# Patient Record
Sex: Male | Born: 1995 | Race: White | Hispanic: No | Marital: Single | Smoking: Never smoker
Health system: Southern US, Community
[De-identification: ages and names within clinical notes are randomized; demographics above are authoritative.]

## PROBLEM LIST (undated history)

## (undated) DIAGNOSIS — R29898 Other symptoms and signs involving the musculoskeletal system: Secondary | ICD-10-CM

## (undated) DIAGNOSIS — F329 Major depressive disorder, single episode, unspecified: Secondary | ICD-10-CM

## (undated) DIAGNOSIS — F419 Anxiety disorder, unspecified: Secondary | ICD-10-CM

## (undated) DIAGNOSIS — F32A Depression, unspecified: Secondary | ICD-10-CM

## (undated) HISTORY — PX: TYMPANOSTOMY TUBE PLACEMENT: SHX32

## (undated) HISTORY — DX: Anxiety disorder, unspecified: F41.9

## (undated) HISTORY — DX: Depression, unspecified: F32.A

---
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
## 2016-09-30 ENCOUNTER — Encounter: Payer: BLUE CROSS/BLUE SHIELD | Admitting: Family Medicine

## 2016-12-05 DIAGNOSIS — R51 Headache: Secondary | ICD-10-CM | POA: Diagnosis not present

## 2016-12-05 DIAGNOSIS — R202 Paresthesia of skin: Secondary | ICD-10-CM | POA: Diagnosis not present

## 2016-12-05 DIAGNOSIS — R42 Dizziness and giddiness: Secondary | ICD-10-CM | POA: Diagnosis not present

## 2016-12-19 DIAGNOSIS — R51 Headache: Secondary | ICD-10-CM | POA: Diagnosis not present

## 2016-12-19 DIAGNOSIS — R42 Dizziness and giddiness: Secondary | ICD-10-CM | POA: Diagnosis not present

## 2016-12-19 DIAGNOSIS — M62838 Other muscle spasm: Secondary | ICD-10-CM | POA: Diagnosis not present

## 2016-12-19 DIAGNOSIS — R202 Paresthesia of skin: Secondary | ICD-10-CM | POA: Diagnosis not present

## 2016-12-23 DIAGNOSIS — M9902 Segmental and somatic dysfunction of thoracic region: Secondary | ICD-10-CM | POA: Diagnosis not present

## 2016-12-23 DIAGNOSIS — M25511 Pain in right shoulder: Secondary | ICD-10-CM | POA: Diagnosis not present

## 2016-12-23 DIAGNOSIS — M9903 Segmental and somatic dysfunction of lumbar region: Secondary | ICD-10-CM | POA: Diagnosis not present

## 2016-12-23 DIAGNOSIS — M9901 Segmental and somatic dysfunction of cervical region: Secondary | ICD-10-CM | POA: Diagnosis not present

## 2017-01-02 DIAGNOSIS — K920 Hematemesis: Secondary | ICD-10-CM | POA: Diagnosis not present

## 2017-01-03 DIAGNOSIS — K920 Hematemesis: Secondary | ICD-10-CM | POA: Diagnosis not present

## 2017-01-20 DIAGNOSIS — M9903 Segmental and somatic dysfunction of lumbar region: Secondary | ICD-10-CM | POA: Diagnosis not present

## 2017-01-20 DIAGNOSIS — M9901 Segmental and somatic dysfunction of cervical region: Secondary | ICD-10-CM | POA: Diagnosis not present

## 2017-01-20 DIAGNOSIS — M25511 Pain in right shoulder: Secondary | ICD-10-CM | POA: Diagnosis not present

## 2017-01-20 DIAGNOSIS — M9902 Segmental and somatic dysfunction of thoracic region: Secondary | ICD-10-CM | POA: Diagnosis not present

## 2017-01-23 DIAGNOSIS — R51 Headache: Secondary | ICD-10-CM | POA: Diagnosis not present

## 2017-01-23 DIAGNOSIS — R202 Paresthesia of skin: Secondary | ICD-10-CM | POA: Diagnosis not present

## 2017-01-23 DIAGNOSIS — R42 Dizziness and giddiness: Secondary | ICD-10-CM | POA: Diagnosis not present

## 2017-02-05 DIAGNOSIS — R202 Paresthesia of skin: Secondary | ICD-10-CM | POA: Diagnosis not present

## 2017-02-05 DIAGNOSIS — E538 Deficiency of other specified B group vitamins: Secondary | ICD-10-CM | POA: Diagnosis not present

## 2017-02-26 DIAGNOSIS — L239 Allergic contact dermatitis, unspecified cause: Secondary | ICD-10-CM | POA: Diagnosis not present

## 2017-04-07 DIAGNOSIS — R5383 Other fatigue: Secondary | ICD-10-CM | POA: Diagnosis not present

## 2017-04-27 DIAGNOSIS — R202 Paresthesia of skin: Secondary | ICD-10-CM | POA: Diagnosis not present

## 2017-11-11 DIAGNOSIS — F419 Anxiety disorder, unspecified: Secondary | ICD-10-CM | POA: Diagnosis not present

## 2017-12-01 DIAGNOSIS — F419 Anxiety disorder, unspecified: Secondary | ICD-10-CM | POA: Diagnosis not present

## 2017-12-01 DIAGNOSIS — R109 Unspecified abdominal pain: Secondary | ICD-10-CM | POA: Diagnosis not present

## 2017-12-01 DIAGNOSIS — R112 Nausea with vomiting, unspecified: Secondary | ICD-10-CM | POA: Diagnosis not present

## 2017-12-16 DIAGNOSIS — R1110 Vomiting, unspecified: Secondary | ICD-10-CM | POA: Diagnosis not present

## 2017-12-16 DIAGNOSIS — R1084 Generalized abdominal pain: Secondary | ICD-10-CM | POA: Diagnosis not present

## 2017-12-16 DIAGNOSIS — R197 Diarrhea, unspecified: Secondary | ICD-10-CM | POA: Diagnosis not present

## 2017-12-31 DIAGNOSIS — R1084 Generalized abdominal pain: Secondary | ICD-10-CM | POA: Diagnosis not present

## 2018-01-08 DIAGNOSIS — R110 Nausea: Secondary | ICD-10-CM | POA: Diagnosis not present

## 2018-01-08 DIAGNOSIS — R1084 Generalized abdominal pain: Secondary | ICD-10-CM | POA: Diagnosis not present

## 2018-01-18 DIAGNOSIS — Z79899 Other long term (current) drug therapy: Secondary | ICD-10-CM | POA: Diagnosis not present

## 2018-01-18 DIAGNOSIS — R112 Nausea with vomiting, unspecified: Secondary | ICD-10-CM | POA: Diagnosis not present

## 2018-01-18 DIAGNOSIS — R109 Unspecified abdominal pain: Secondary | ICD-10-CM | POA: Diagnosis not present

## 2018-01-18 DIAGNOSIS — K648 Other hemorrhoids: Secondary | ICD-10-CM | POA: Diagnosis not present

## 2018-01-18 DIAGNOSIS — K2950 Unspecified chronic gastritis without bleeding: Secondary | ICD-10-CM | POA: Diagnosis not present

## 2018-01-18 DIAGNOSIS — Z6822 Body mass index (BMI) 22.0-22.9, adult: Secondary | ICD-10-CM | POA: Diagnosis not present

## 2018-01-18 DIAGNOSIS — F419 Anxiety disorder, unspecified: Secondary | ICD-10-CM | POA: Diagnosis not present

## 2018-01-18 DIAGNOSIS — R197 Diarrhea, unspecified: Secondary | ICD-10-CM | POA: Diagnosis not present

## 2018-01-18 DIAGNOSIS — R1084 Generalized abdominal pain: Secondary | ICD-10-CM | POA: Diagnosis not present

## 2018-01-18 DIAGNOSIS — R1013 Epigastric pain: Secondary | ICD-10-CM | POA: Diagnosis not present

## 2018-01-18 DIAGNOSIS — R634 Abnormal weight loss: Secondary | ICD-10-CM | POA: Diagnosis not present

## 2018-06-08 ENCOUNTER — Telehealth: Payer: Self-pay

## 2018-06-08 NOTE — Telephone Encounter (Signed)
Called pt and ask to call me back on my direct number to schedule for an appt of CPE appt last seen in 2017.

## 2018-10-28 DIAGNOSIS — M9903 Segmental and somatic dysfunction of lumbar region: Secondary | ICD-10-CM | POA: Diagnosis not present

## 2018-10-28 DIAGNOSIS — M62830 Muscle spasm of back: Secondary | ICD-10-CM | POA: Diagnosis not present

## 2018-10-28 DIAGNOSIS — M5416 Radiculopathy, lumbar region: Secondary | ICD-10-CM | POA: Diagnosis not present

## 2018-10-28 DIAGNOSIS — M9901 Segmental and somatic dysfunction of cervical region: Secondary | ICD-10-CM | POA: Diagnosis not present

## 2018-10-29 ENCOUNTER — Other Ambulatory Visit: Payer: Self-pay

## 2018-10-29 ENCOUNTER — Ambulatory Visit: Payer: BLUE CROSS/BLUE SHIELD | Admitting: Family Medicine

## 2018-10-29 ENCOUNTER — Encounter: Payer: Self-pay | Admitting: Family Medicine

## 2018-10-29 VITALS — BP 102/64 | HR 64 | Temp 97.1°F | Resp 16 | Ht 69.0 in | Wt 153.4 lb

## 2018-10-29 DIAGNOSIS — F419 Anxiety disorder, unspecified: Secondary | ICD-10-CM | POA: Diagnosis not present

## 2018-10-29 DIAGNOSIS — E538 Deficiency of other specified B group vitamins: Secondary | ICD-10-CM

## 2018-10-29 DIAGNOSIS — F329 Major depressive disorder, single episode, unspecified: Secondary | ICD-10-CM | POA: Diagnosis not present

## 2018-10-29 DIAGNOSIS — F32A Depression, unspecified: Secondary | ICD-10-CM

## 2018-10-29 LAB — COMPREHENSIVE METABOLIC PANEL
ALT: 14 U/L (ref 0–53)
AST: 15 U/L (ref 0–37)
Albumin: 4.6 g/dL (ref 3.5–5.2)
Alkaline Phosphatase: 75 U/L (ref 39–117)
BUN: 10 mg/dL (ref 6–23)
CO2: 31 mEq/L (ref 19–32)
Calcium: 9.8 mg/dL (ref 8.4–10.5)
Chloride: 105 mEq/L (ref 96–112)
Creatinine, Ser: 0.97 mg/dL (ref 0.40–1.50)
GFR: 96.45 mL/min (ref >60.00)
Glucose, Bld: 82 mg/dL (ref 70–99)
Potassium: 4.3 mEq/L (ref 3.5–5.1)
Sodium: 142 mEq/L (ref 135–145)
Total Bilirubin: 0.4 mg/dL (ref 0.2–1.2)
Total Protein: 6.8 g/dL (ref 6.0–8.3)

## 2018-10-29 LAB — CBC WITH DIFFERENTIAL/PLATELET
Basophils Absolute: 0 10*3/uL (ref 0.0–0.1)
Basophils Relative: 0.4 % (ref 0.0–3.0)
Eosinophils Absolute: 0.1 10*3/uL (ref 0.0–0.7)
Eosinophils Relative: 1.8 % (ref 0.0–5.0)
HCT: 41.8 % (ref 39.0–52.0)
Hemoglobin: 14.3 g/dL (ref 13.0–17.0)
Lymphocytes Relative: 36.9 % (ref 12.0–46.0)
Lymphs Abs: 1.4 10*3/uL (ref 0.7–4.0)
MCHC: 34.1 g/dL (ref 30.0–36.0)
MCV: 88.2 fl (ref 78.0–100.0)
Monocytes Absolute: 0.4 10*3/uL (ref 0.1–1.0)
Monocytes Relative: 9.8 % (ref 3.0–12.0)
Neutro Abs: 2 10*3/uL (ref 1.4–7.7)
Neutrophils Relative %: 51.1 % (ref 43.0–77.0)
Platelets: 204 10*3/uL (ref 150.0–400.0)
RBC: 4.74 Mil/uL (ref 4.22–5.81)
RDW: 12.8 % (ref 11.5–15.5)
WBC: 3.9 10*3/uL — ABNORMAL LOW (ref 4.0–10.5)

## 2018-10-29 LAB — IBC + FERRITIN
Ferritin: 44.8 ng/mL (ref 22.0–322.0)
Iron: 102 ug/dL (ref 42–165)
Saturation Ratios: 31 % (ref 20.0–50.0)
Transferrin: 235 mg/dL (ref 212.0–360.0)

## 2018-10-29 LAB — B12 AND FOLATE PANEL
Folate: 9.1 ng/mL (ref >5.9)
Vitamin B-12: 164 pg/mL — ABNORMAL LOW (ref 211–911)

## 2018-10-29 LAB — VITAMIN D 25 HYDROXY (VIT D DEFICIENCY, FRACTURES): VITD: 31.99 ng/mL (ref 30.00–100.00)

## 2018-10-29 LAB — TSH: TSH: 1.62 u[IU]/mL (ref 0.35–4.50)

## 2018-10-29 MED ORDER — MIRTAZAPINE 30 MG PO TABS: 30.0000 mg | ORAL_TABLET | Freq: Every day | ORAL | 1 refills | Status: DC

## 2018-10-29 MED ORDER — CYANOCOBALAMIN 500 MCG PO TABS: 500.0000 ug | ORAL_TABLET | Freq: Every day | ORAL | 2 refills | Status: AC

## 2018-10-29 MED ORDER — HYDROXYZINE HCL 25 MG PO TABS: ORAL_TABLET | ORAL | 2 refills | Status: DC

## 2018-10-29 NOTE — Addendum Note (Signed)
Addended by: Philis Nettle on: 10/29/2018 04:50 PM   Modules accepted: Orders

## 2018-10-29 NOTE — Progress Notes (Signed)
Subjective:    Patient ID: Jackson Baxter, male    DOB: June 15, 1995, 23 y.o.   MRN: [id number]  HPI   Patient presents to clinic to establish with PCP.  He had previously come to our office, but was last seen in 2017 by Dr. Dan HumphreysWalker.  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  States while away at school, he began develop issues with anxiety and depression and was treated by doctors out in [place].  He originally was started on Wellbutrin, but started to have issues with eating and weight loss so they switched him over to Remeron which for the most part keeps him stable, feels he sleeps well and eats well on this medication.  Also uses hydroxyzine as needed for breakthrough anxiety.  Denies any SI or HI.  Overall is pleased to be back home to spend time with his parents, but his goal over the next 6 months to a year is to line up a job back in [place], has a lot of friends in the region and would prefer to live there rather than back home in [place] where he grew up.  Patient also self-reports a history of B12 deficiency and iron deficiency, we will be sure to follow-up and check these in the blood work.  Past medical, social, surgical family history reviewed and updated in chart.  Flu vaccine is up-to-date, had yesterday at the [facility].  Patient Active Problem List   Diagnosis Date Noted  . Depression with anxiety 09/26/2015  . Vomiting alone 09/25/2014  . Cerumen impaction 09/25/2014  . Routine general medical examination at a health care facility 07/29/2012   Social History   Tobacco Use  . Smoking status: Never Smoker  . Smokeless tobacco: Never Used  Substance Use Topics  . Alcohol use: Yes    Comment: ocassionally   Family History  Problem Relation Age of Onset  . Hypertension Mother   . Hyperlipidemia Father   . Cancer Father        melanoma  . Diabetes Paternal Aunt   . Diabetes  Paternal Grandmother   . Diabetes Paternal Grandfather    Social History   Tobacco Use  . Smoking status: Never Smoker  . Smokeless tobacco: Never Used  Substance Use Topics  . Alcohol use: Yes    Comment: ocassionally   Review of Systems   Constitutional: Negative for chills, fatigue and fever.  HENT: Negative for congestion, ear pain, sinus pain and sore throat.   Eyes: Negative.   Respiratory: Negative for cough, shortness of breath and wheezing.   Cardiovascular: Negative for chest pain, palpitations and leg swelling.  Gastrointestinal: Negative for abdominal pain, diarrhea, nausea and vomiting.  Genitourinary: Negative for dysuria, frequency and urgency.  Musculoskeletal: Negative for arthralgias and myalgias.  Skin: Negative for color change, pallor and rash.  Neurological: Negative for syncope, light-headedness and headaches.  Psychiatric/Behavioral: The patient is not nervous/anxious.       Objective:   Physical Exam Vitals signs and nursing note reviewed.  Constitutional:      General: He is not in acute distress.    Appearance: Normal appearance. He is not toxic-appearing.  Eyes:     General: No scleral icterus.    Extraocular Movements: Extraocular movements intact.     Pupils: Pupils are equal, round, and reactive to light.  Cardiovascular:     Rate and Rhythm: Normal rate and regular rhythm.  Heart sounds: Normal heart sounds.  Pulmonary:     Effort: Pulmonary effort is normal. No respiratory distress.     Breath sounds: Normal breath sounds.  Musculoskeletal:     Right lower leg: No edema.     Left lower leg: No edema.  Neurological:     General: No focal deficit present.     Mental Status: He is alert and oriented to person, place, and time.     Gait: Gait normal.  Psychiatric:        Mood and Affect: Mood normal.        Behavior: Behavior normal.     Today's Vitals   10/29/18 1320  BP: 102/64  Pulse: 64  Resp: 16  Temp: (!) 97.1 F (36.2  C)  TempSrc: Temporal  SpO2: 98%  Weight: 153 lb 6.4 oz (69.6 kg)  Height: 5\' 9"  (1.753 m)   Body mass index is 22.65 kg/m.     Assessment & Plan:    Anxiety and depression - patient's mood is stable on Remeron 30 mg/day and uses hydroxyzine as needed.  Patient does not want to see a counselor at this time, but he is now aware that we do offer counseling services through the lobe our behavioral health clinics and we do have a licensed clinical social worker on staff here that we can set him up with if he chooses to do this.  Patient has good support from his parents and they are helping him navigate his next steps including job hunting and preparing to hopefully move back to [place].  Lab work drawn in clinic.  Patient will follow-up in about 4 weeks for recheck and how he is doing on medications and we will also do a CPE at that time.

## 2018-11-04 DIAGNOSIS — M9901 Segmental and somatic dysfunction of cervical region: Secondary | ICD-10-CM | POA: Diagnosis not present

## 2018-11-04 DIAGNOSIS — M62830 Muscle spasm of back: Secondary | ICD-10-CM | POA: Diagnosis not present

## 2018-11-04 DIAGNOSIS — M5416 Radiculopathy, lumbar region: Secondary | ICD-10-CM | POA: Diagnosis not present

## 2018-11-04 DIAGNOSIS — M9903 Segmental and somatic dysfunction of lumbar region: Secondary | ICD-10-CM | POA: Diagnosis not present

## 2018-11-11 DIAGNOSIS — M9903 Segmental and somatic dysfunction of lumbar region: Secondary | ICD-10-CM | POA: Diagnosis not present

## 2018-11-11 DIAGNOSIS — M9901 Segmental and somatic dysfunction of cervical region: Secondary | ICD-10-CM | POA: Diagnosis not present

## 2018-11-11 DIAGNOSIS — M62830 Muscle spasm of back: Secondary | ICD-10-CM | POA: Diagnosis not present

## 2018-11-11 DIAGNOSIS — M5416 Radiculopathy, lumbar region: Secondary | ICD-10-CM | POA: Diagnosis not present

## 2018-11-18 DIAGNOSIS — M62830 Muscle spasm of back: Secondary | ICD-10-CM | POA: Diagnosis not present

## 2018-11-18 DIAGNOSIS — M9903 Segmental and somatic dysfunction of lumbar region: Secondary | ICD-10-CM | POA: Diagnosis not present

## 2018-11-18 DIAGNOSIS — M9901 Segmental and somatic dysfunction of cervical region: Secondary | ICD-10-CM | POA: Diagnosis not present

## 2018-11-18 DIAGNOSIS — M5416 Radiculopathy, lumbar region: Secondary | ICD-10-CM | POA: Diagnosis not present

## 2018-11-25 ENCOUNTER — Encounter: Payer: BC Managed Care – PPO | Admitting: Family Medicine

## 2018-11-25 DIAGNOSIS — Z0289 Encounter for other administrative examinations: Secondary | ICD-10-CM

## 2018-12-13 DIAGNOSIS — M62830 Muscle spasm of back: Secondary | ICD-10-CM | POA: Diagnosis not present

## 2018-12-13 DIAGNOSIS — M9903 Segmental and somatic dysfunction of lumbar region: Secondary | ICD-10-CM | POA: Diagnosis not present

## 2018-12-13 DIAGNOSIS — M5416 Radiculopathy, lumbar region: Secondary | ICD-10-CM | POA: Diagnosis not present

## 2018-12-13 DIAGNOSIS — M9901 Segmental and somatic dysfunction of cervical region: Secondary | ICD-10-CM | POA: Diagnosis not present

## 2018-12-20 DIAGNOSIS — M9903 Segmental and somatic dysfunction of lumbar region: Secondary | ICD-10-CM | POA: Diagnosis not present

## 2018-12-20 DIAGNOSIS — M9901 Segmental and somatic dysfunction of cervical region: Secondary | ICD-10-CM | POA: Diagnosis not present

## 2018-12-20 DIAGNOSIS — M62830 Muscle spasm of back: Secondary | ICD-10-CM | POA: Diagnosis not present

## 2018-12-20 DIAGNOSIS — M5416 Radiculopathy, lumbar region: Secondary | ICD-10-CM | POA: Diagnosis not present

## 2018-12-27 DIAGNOSIS — M9901 Segmental and somatic dysfunction of cervical region: Secondary | ICD-10-CM | POA: Diagnosis not present

## 2018-12-27 DIAGNOSIS — M5416 Radiculopathy, lumbar region: Secondary | ICD-10-CM | POA: Diagnosis not present

## 2018-12-27 DIAGNOSIS — M62830 Muscle spasm of back: Secondary | ICD-10-CM | POA: Diagnosis not present

## 2018-12-27 DIAGNOSIS — M9903 Segmental and somatic dysfunction of lumbar region: Secondary | ICD-10-CM | POA: Diagnosis not present

## 2019-01-03 DIAGNOSIS — M62830 Muscle spasm of back: Secondary | ICD-10-CM | POA: Diagnosis not present

## 2019-01-03 DIAGNOSIS — M9901 Segmental and somatic dysfunction of cervical region: Secondary | ICD-10-CM | POA: Diagnosis not present

## 2019-01-03 DIAGNOSIS — M9903 Segmental and somatic dysfunction of lumbar region: Secondary | ICD-10-CM | POA: Diagnosis not present

## 2019-01-03 DIAGNOSIS — M5416 Radiculopathy, lumbar region: Secondary | ICD-10-CM | POA: Diagnosis not present

## 2019-01-17 DIAGNOSIS — M9901 Segmental and somatic dysfunction of cervical region: Secondary | ICD-10-CM | POA: Diagnosis not present

## 2019-01-17 DIAGNOSIS — M5416 Radiculopathy, lumbar region: Secondary | ICD-10-CM | POA: Diagnosis not present

## 2019-01-17 DIAGNOSIS — M9903 Segmental and somatic dysfunction of lumbar region: Secondary | ICD-10-CM | POA: Diagnosis not present

## 2019-01-17 DIAGNOSIS — M62830 Muscle spasm of back: Secondary | ICD-10-CM | POA: Diagnosis not present

## 2019-02-07 DIAGNOSIS — M9901 Segmental and somatic dysfunction of cervical region: Secondary | ICD-10-CM | POA: Diagnosis not present

## 2019-02-07 DIAGNOSIS — M9903 Segmental and somatic dysfunction of lumbar region: Secondary | ICD-10-CM | POA: Diagnosis not present

## 2019-02-07 DIAGNOSIS — M62830 Muscle spasm of back: Secondary | ICD-10-CM | POA: Diagnosis not present

## 2019-02-07 DIAGNOSIS — M5416 Radiculopathy, lumbar region: Secondary | ICD-10-CM | POA: Diagnosis not present

## 2019-02-21 DIAGNOSIS — M9903 Segmental and somatic dysfunction of lumbar region: Secondary | ICD-10-CM | POA: Diagnosis not present

## 2019-02-21 DIAGNOSIS — M62830 Muscle spasm of back: Secondary | ICD-10-CM | POA: Diagnosis not present

## 2019-02-21 DIAGNOSIS — M5416 Radiculopathy, lumbar region: Secondary | ICD-10-CM | POA: Diagnosis not present

## 2019-02-21 DIAGNOSIS — M9901 Segmental and somatic dysfunction of cervical region: Secondary | ICD-10-CM | POA: Diagnosis not present

## 2019-03-07 DIAGNOSIS — M9901 Segmental and somatic dysfunction of cervical region: Secondary | ICD-10-CM | POA: Diagnosis not present

## 2019-03-07 DIAGNOSIS — M5416 Radiculopathy, lumbar region: Secondary | ICD-10-CM | POA: Diagnosis not present

## 2019-03-07 DIAGNOSIS — M62830 Muscle spasm of back: Secondary | ICD-10-CM | POA: Diagnosis not present

## 2019-03-07 DIAGNOSIS — M9903 Segmental and somatic dysfunction of lumbar region: Secondary | ICD-10-CM | POA: Diagnosis not present

## 2019-03-16 ENCOUNTER — Encounter: Payer: Self-pay | Admitting: Family

## 2019-03-16 ENCOUNTER — Ambulatory Visit (INDEPENDENT_AMBULATORY_CARE_PROVIDER_SITE_OTHER): Payer: BC Managed Care – PPO | Admitting: Family

## 2019-03-16 ENCOUNTER — Other Ambulatory Visit: Payer: Self-pay

## 2019-03-16 VITALS — Ht 69.0 in | Wt 153.0 lb

## 2019-03-16 DIAGNOSIS — F419 Anxiety disorder, unspecified: Secondary | ICD-10-CM

## 2019-03-16 DIAGNOSIS — F32A Depression, unspecified: Secondary | ICD-10-CM

## 2019-03-16 DIAGNOSIS — F329 Major depressive disorder, single episode, unspecified: Secondary | ICD-10-CM

## 2019-03-16 DIAGNOSIS — E538 Deficiency of other specified B group vitamins: Secondary | ICD-10-CM

## 2019-03-16 DIAGNOSIS — Z0000 Encounter for general adult medical examination without abnormal findings: Secondary | ICD-10-CM

## 2019-03-16 DIAGNOSIS — F418 Other specified anxiety disorders: Secondary | ICD-10-CM

## 2019-03-16 MED ORDER — HYDROXYZINE HCL 25 MG PO TABS: ORAL_TABLET | ORAL | 2 refills | Status: AC

## 2019-03-16 MED ORDER — MIRTAZAPINE 30 MG PO TABS: 30.0000 mg | ORAL_TABLET | Freq: Every day | ORAL | 1 refills | Status: AC

## 2019-03-16 NOTE — Progress Notes (Signed)
Virtual Visit via Video Note  I connected with@  on 03/16/19 at 10:30 AM EST by a video enabled telemedicine application and verified that I am speaking with the correct person using two identifiers.  Location patient: home Location provider:work  Persons participating in the virtual visit: patient, provider  I discussed the limitations of evaluation and management by telemedicine and the availability of in person appointments. The patient expressed understanding and agreed to proceed.  Interactive audio and video telecommunications were attempted between this provider and patient, however failed, due to patient having technical difficulties or patient did not have access to video capability.  We continued and completed visit with audio only.    HPI:  CPE Establish care Feels well , no new concerns. H/o b12 deficiency- taking b12.  Has seen dermatologist in the past. Father has h/o melanoma.  NO masses appreciated, swelling or pain in testicles. No concerns for STDs however would like HIV screen as never had this in the past.   Depression and anxiety- compliant with medication. Sleeping well. Takes atarax as needed,anxiety  'not as bad lately.' No panic attacks, however states used to have panic attacks over a year ago. Atarax has been helpful. Supportive family and friends whom he talk to.  No SI, no h/o SI. No HI. No h/o self harm.  Looking for jobs in Engineer, production and would like to move back in [place]. Feeling unfiulled looking for job and it is not 'moving as fast as I would like', this has contributed somewhat to his depression and anxiety, we feel that this is situational and declines counseling or any changes in medications.  Exercise and weightlifting is helpful for his mood.   ROS: See pertinent positives and negatives per HPI.  Past Medical History:  Diagnosis Date  . Anxiety   . Back complaints 2010  . Depression     Past Surgical History:  Procedure Laterality Date   . TYMPANOSTOMY TUBE PLACEMENT      Family History  Problem Relation Age of Onset  . Hypertension Mother   . Hyperlipidemia Father   . Cancer Father        melanoma  . Diabetes Paternal Aunt   . Diabetes Paternal Grandmother   . Diabetes Paternal Grandfather   . Prostate cancer Neg Hx   . Colon cancer Neg Hx     SOCIAL HX: never smoker   Current Outpatient Medications:  .  hydrOXYzine (ATARAX/VISTARIL) 25 MG tablet, Take 1 or 2 tablets 3 times per day as needed, Disp: 30 tablet, Rfl: 2 .  mirtazapine (REMERON) 30 MG tablet, Take 1 tablet (30 mg total) by mouth at bedtime., Disp: 90 tablet, Rfl: 1 .  vitamin B-12 (CYANOCOBALAMIN) 500 MCG tablet, Take 1 tablet (500 mcg total) by mouth daily., Disp: 30 tablet, Rfl: 2  EXAM:  VITALS per patient if applicable:  GENERAL: alert, oriented, appears well and in no acute distress  HEENT: atraumatic, conjunttiva clear, no obvious abnormalities on inspection of external nose and ears  NECK: normal movements of the head and neck  LUNGS: on inspection no signs of respiratory distress, breathing rate appears normal, no obvious gross SOB, gasping or wheezing  CV: no obvious cyanosis  MS: moves all visible extremities without noticeable abnormality  PSYCH/NEURO: pleasant and cooperative, no obvious depression or anxiety, speech and thought processing grossly intact  ASSESSMENT AND PLAN:  Discussed the following assessment and plan:  Routine general medical examination at a health care facility - Plan: HIV Antibody (  routine testing w rflx), B12 and Folate Panel, Ambulatory referral to Dermatology  Anxiety and depression - Plan: hydrOXYzine (ATARAX/VISTARIL) 25 MG tablet, mirtazapine (REMERON) 30 MG tablet  B12 deficiency  Depression with anxiety Problem List Items Addressed This Visit      Other   B12 deficiency    H/o ; patient will call to schedule lab to have this rechecked.      Depression with anxiety    Some worsening  with job looking and missing friends in [place]. No SI.  Patient very politely declines any changes to his medication regimen as he feels like this will improve as the situation changes.  He has supportive family and friends.  Also offered counseling today, he declines although he will consider this in the future      Relevant Medications   hydrOXYzine (ATARAX/VISTARIL) 25 MG tablet   mirtazapine (REMERON) 30 MG tablet   Routine general medical examination at a health care facility - Primary    Advised testicular exam and discussed incidence , symptoms of testicular cancer. Declines screening labs as he had labs done with prior PCP in 11/2018. Pending hiv screen, dermatology referral due to father's history      Relevant Orders   HIV Antibody (routine testing w rflx)   B12 and Folate Panel   Ambulatory referral to Dermatology    Other Visit Diagnoses    Anxiety and depression       Relevant Medications   hydrOXYzine (ATARAX/VISTARIL) 25 MG tablet   mirtazapine (REMERON) 30 MG tablet      -we discussed possible serious and likely etiologies, options for evaluation and workup, limitations of telemedicine visit vs in person visit, treatment, treatment risks and precautions. Pt prefers to treat via telemedicine empirically rather then risking or undertaking an in person visit at this moment. Patient agrees to seek prompt in person care if worsening, new symptoms arise, or if is not improving with treatment.   I discussed the assessment and treatment plan with the patient. The patient was provided an opportunity to ask questions and all were answered. The patient agreed with the plan and demonstrated an understanding of the instructions.   The patient was advised to call back or seek an in-person evaluation if the symptoms worsen or if the condition fails to improve as anticipated.   Rennie Plowman, FNP   I spent 15 min non face to face w/ pt.

## 2019-03-16 NOTE — Assessment & Plan Note (Signed)
H/o ; patient will call to schedule lab to have this rechecked.

## 2019-03-16 NOTE — Assessment & Plan Note (Signed)
Some worsening with job looking and missing friends in [place]. No SI.  Patient very politely declines any changes to his medication regimen as he feels like this will improve as the situation changes.  He has supportive family and friends.  Also offered counseling today, he declines although he will consider this in the future

## 2019-03-16 NOTE — Assessment & Plan Note (Signed)
Advised testicular exam and discussed incidence , symptoms of testicular cancer. Declines screening labs as he had labs done with prior PCP in 11/2018. Pending hiv screen, dermatology referral due to father's history

## 2019-03-21 DIAGNOSIS — D2272 Melanocytic nevi of left lower limb, including hip: Secondary | ICD-10-CM | POA: Diagnosis not present

## 2019-03-21 DIAGNOSIS — D485 Neoplasm of uncertain behavior of skin: Secondary | ICD-10-CM | POA: Diagnosis not present

## 2019-03-21 DIAGNOSIS — D225 Melanocytic nevi of trunk: Secondary | ICD-10-CM | POA: Diagnosis not present

## 2019-03-21 DIAGNOSIS — D2261 Melanocytic nevi of right upper limb, including shoulder: Secondary | ICD-10-CM | POA: Diagnosis not present

## 2019-03-21 DIAGNOSIS — M62830 Muscle spasm of back: Secondary | ICD-10-CM | POA: Diagnosis not present

## 2019-03-21 DIAGNOSIS — M5416 Radiculopathy, lumbar region: Secondary | ICD-10-CM | POA: Diagnosis not present

## 2019-03-21 DIAGNOSIS — D2262 Melanocytic nevi of left upper limb, including shoulder: Secondary | ICD-10-CM | POA: Diagnosis not present

## 2019-03-21 DIAGNOSIS — M9903 Segmental and somatic dysfunction of lumbar region: Secondary | ICD-10-CM | POA: Diagnosis not present

## 2019-03-21 DIAGNOSIS — M9901 Segmental and somatic dysfunction of cervical region: Secondary | ICD-10-CM | POA: Diagnosis not present

## 2019-03-29 DIAGNOSIS — D235 Other benign neoplasm of skin of trunk: Secondary | ICD-10-CM | POA: Diagnosis not present

## 2019-03-29 DIAGNOSIS — D225 Melanocytic nevi of trunk: Secondary | ICD-10-CM | POA: Diagnosis not present

## 2021-08-16 ENCOUNTER — Telehealth: Payer: Self-pay

## 2021-08-16 NOTE — Telephone Encounter (Signed)
Called to see if he was still a patient of ours because he has not been seen since 2021 or to see if he has  established care with another provider.
